# Patient Record
Sex: Female | Born: 1965 | Race: White | Hispanic: No | Marital: Single | Smoking: Current every day smoker
Health system: Southern US, Community
[De-identification: ages and names within clinical notes are randomized; demographics above are authoritative.]

## PROBLEM LIST (undated history)

## (undated) HISTORY — PX: CHOLECYSTECTOMY: SHX55

---

## 2009-11-10 ENCOUNTER — Emergency Department: Payer: Self-pay | Admitting: Emergency Medicine

## 2009-11-17 ENCOUNTER — Emergency Department: Payer: Self-pay | Admitting: Emergency Medicine

## 2010-08-20 ENCOUNTER — Emergency Department: Payer: Self-pay | Admitting: Emergency Medicine

## 2011-03-12 ENCOUNTER — Emergency Department: Payer: Self-pay | Admitting: Emergency Medicine

## 2011-03-14 ENCOUNTER — Emergency Department: Payer: Self-pay | Admitting: *Deleted

## 2011-05-12 ENCOUNTER — Ambulatory Visit: Payer: Self-pay | Admitting: General Practice

## 2011-07-27 ENCOUNTER — Emergency Department: Payer: Self-pay | Admitting: Emergency Medicine

## 2011-10-24 ENCOUNTER — Emergency Department: Payer: Self-pay | Admitting: *Deleted

## 2012-03-10 ENCOUNTER — Emergency Department: Payer: Self-pay | Admitting: Emergency Medicine

## 2017-01-03 ENCOUNTER — Encounter: Payer: Self-pay | Admitting: Emergency Medicine

## 2017-01-03 ENCOUNTER — Emergency Department
Admission: EM | Admit: 2017-01-03 | Discharge: 2017-01-03 | Disposition: A | Discharge status: Home / Self Care | Payer: Self-pay | Attending: Emergency Medicine | Admitting: Emergency Medicine

## 2017-01-03 DIAGNOSIS — F17210 Nicotine dependence, cigarettes, uncomplicated: Secondary | ICD-10-CM | POA: Insufficient documentation

## 2017-01-03 DIAGNOSIS — S61215A Laceration without foreign body of left ring finger without damage to nail, initial encounter: Secondary | ICD-10-CM | POA: Insufficient documentation

## 2017-01-03 DIAGNOSIS — Y990 Civilian activity done for income or pay: Secondary | ICD-10-CM | POA: Insufficient documentation

## 2017-01-03 DIAGNOSIS — W312XXA Contact with powered woodworking and forming machines, initial encounter: Secondary | ICD-10-CM | POA: Insufficient documentation

## 2017-01-03 DIAGNOSIS — Y929 Unspecified place or not applicable: Secondary | ICD-10-CM | POA: Insufficient documentation

## 2017-01-03 DIAGNOSIS — S61217A Laceration without foreign body of left little finger without damage to nail, initial encounter: Secondary | ICD-10-CM | POA: Insufficient documentation

## 2017-01-03 DIAGNOSIS — Y9389 Activity, other specified: Secondary | ICD-10-CM | POA: Insufficient documentation

## 2017-01-03 DIAGNOSIS — S61213A Laceration without foreign body of left middle finger without damage to nail, initial encounter: Secondary | ICD-10-CM | POA: Insufficient documentation

## 2017-01-03 MED ORDER — TETANUS-DIPHTH-ACELL PERTUSSIS 5-2.5-18.5 LF-MCG/0.5 IM SUSP: 0.5000 mL | Freq: Once | INTRAMUSCULAR | Status: DC

## 2017-01-03 MED ORDER — LIDOCAINE HCL (PF) 1 % IJ SOLN
INTRAMUSCULAR | Status: AC
  Administered 2017-01-03: 10 mL
  Filled 2017-01-03: qty 10

## 2017-01-03 MED ORDER — LIDOCAINE HCL (PF) 1 % IJ SOLN
10.0000 mL | Freq: Once | INTRAMUSCULAR | Status: AC
  Administered 2017-01-03: 10 mL

## 2017-01-03 MED ORDER — TRAMADOL HCL 50 MG PO TABS
50.0000 mg | ORAL_TABLET | Freq: Once | ORAL | Status: AC
  Administered 2017-01-03: 50 mg via ORAL
  Filled 2017-01-03: qty 1

## 2017-01-03 NOTE — Discharge Instructions (Signed)
You have have your finger lacerations sutured and dressed. Keep the wound clean, dry, and covered. Wear the gloves while working. [facility] or this ED in 7-10 days for suture removal. Use the supplied dressings to keep the wounds covered. Wash only with soap & water.

## 2017-01-03 NOTE — ED Triage Notes (Signed)
Pt was using skill saw and cut 3rd, 4th, and 5th digit left hand.  Able to move all fingers, pt in pain but does not appear broken at this time. Laceration noted to all 3 fingers.  Pt does not feel like it is broken.

## 2017-01-03 NOTE — ED Notes (Signed)
See triage note  States she was using a skill saw and slipped  Lacerations noted to left 3rd,4th and 5 th fingers  Bleeding controlled

## 2017-01-03 NOTE — ED Triage Notes (Signed)
Pt does not want xray

## 2017-01-03 NOTE — ED Provider Notes (Signed)
[facility] Emergency Department Provider Note ____________________________________________  Time seen: 1700  I have reviewed the triage vital signs and the nursing notes.  HISTORY  Chief Complaint  Hand Injury  HPI Carrie Bradford is a 51 y.o. female presents to the ED for evalluation of injury sustained while using a Skil saw worksite today. Patient describes using a Skil saw to reports, when her and slipped. She sustained lacerations to the distal palmar aspects of her middle, ring, and pinky fingers. She reports a current tetanus status at this time.  History reviewed. No pertinent past medical history.  There are no active problems to display for this patient.   Past Surgical History:  Procedure Laterality Date  . CHOLECYSTECTOMY      Prior to Admission medications   Not on File    Allergies Other  History reviewed. No pertinent family history.  Social History Social History  Substance Use Topics  . Smoking status: Current Every Day Smoker  . Smokeless tobacco: Never Used  . Alcohol use No    Review of Systems  Constitutional: Negative for fever. Cardiovascular: Negative for chest pain. Respiratory: Negative for shortness of breath. Musculoskeletal: Negative for back pain. Skin: Negative for rash. Finger lacerations as above. Neurological: Negative for headaches, focal weakness or numbness. ____________________________________________  PHYSICAL EXAM:  VITAL SIGNS: ED Triage Vitals [01/03/17 1807]  Enc Vitals Group     BP      Pulse      Resp      Temp      Temp src      SpO2      Weight      Height      Head Circumference      Peak Flow      Pain Score 9     Pain Loc      Pain Edu?      Excl. in GC?    Constitutional: Alert and oriented. Well appearing and in no distress. Head: Normocephalic and atraumatic. Cardiovascular: Normal rate, regular rhythm. Normal distal pulses. Respiratory: Normal respiratory effort. No  wheezes/rales/rhonchi. Gastrointestinal: Soft and nontender. No distention. Musculoskeletal: multiple superficial lacerations are noted to the dorsal-ulnar aspect of the left middle finger, left ring finger, and the left pinky. Pinky is the most disruptive with a superficial flap noted. Patient with a legitimate and normal composite fist to the left hand.Nontender with normal range of motion in all extremities.  Neurologic:  Normal gross sensation. Normal intrinsic and opposition testing. No gross focal neurologic deficits are appreciated. Skin:  Skin is warm, dry and intact. No rash noted. ____________________________________________  PROCEDURES  Ultram 50 mg PO  LACERATION REPAIR Performed by: Lissa Hoard Authorized by: Lissa Hoard Consent: Verbal consent obtained. Risks and benefits: risks, benefits and alternatives were discussed Consent given by: patient Patient identity confirmed: provided demographic data Prepped and Draped in normal sterile fashion Wound explored  Laceration Location: left pinky finger.  Laceration Length: 1 cm  No Foreign Bodies seen or palpated  Anesthesia: transthecal local infiltration  Local anesthetic: lidocaine 1% w/o epinephrine  Anesthetic total: 3 ml  Irrigation method: syringe Amount of cleaning: standard  Skin closure: 4-0 nylon  Number of sutures: 2   Technique: interrupted  Patient tolerance: Patient tolerated the procedure well with no immediate complications.  The other superficial wounds are dressed with a Vaseline-soaked gauze, nonstick dressing, and tube gauze. ____________________________________________  INITIAL IMPRESSION / ASSESSMENT AND PLAN / ED COURSE  Patient with ED evaluation of multiple lacerations to the last 3 digits of the left hand. Patient with primarily superficial wounds with irregular jagged edges. Single flap to the pinky is secured using 2 interrupted sutures discharged  instructions as well as supplies. She is released to return to work with suction to keep the wound clean, and dry. ____________________________________________  FINAL CLINICAL IMPRESSION(S) / ED DIAGNOSES  Final diagnoses:  Laceration of left little finger without foreign body without damage to nail, initial encounter  Laceration of left ring finger without foreign body without damage to nail, initial encounter  Laceration of left middle finger without foreign body without damage to nail, initial encounter      Lissa HoardMenshew, Gavin Faivre V Bacon, PA-C 01/03/17 1833    Nita SickleVeronese, Foster Brook, MD 01/03/17 2023

## 2018-05-01 ENCOUNTER — Emergency Department: Payer: Self-pay

## 2018-05-01 ENCOUNTER — Other Ambulatory Visit: Payer: Self-pay

## 2018-05-01 ENCOUNTER — Encounter: Payer: Self-pay | Admitting: Emergency Medicine

## 2018-05-01 ENCOUNTER — Emergency Department
Admission: EM | Admit: 2018-05-01 | Discharge: 2018-05-01 | Disposition: A | Discharge status: Home / Self Care | Payer: Self-pay | Attending: Emergency Medicine | Admitting: Emergency Medicine

## 2018-05-01 DIAGNOSIS — F14921 Cocaine use, unspecified with intoxication delirium: Secondary | ICD-10-CM | POA: Insufficient documentation

## 2018-05-01 DIAGNOSIS — Y929 Unspecified place or not applicable: Secondary | ICD-10-CM | POA: Insufficient documentation

## 2018-05-01 DIAGNOSIS — R0602 Shortness of breath: Secondary | ICD-10-CM

## 2018-05-01 DIAGNOSIS — Y902 Blood alcohol level of 40-59 mg/100 ml: Secondary | ICD-10-CM | POA: Insufficient documentation

## 2018-05-01 DIAGNOSIS — F17210 Nicotine dependence, cigarettes, uncomplicated: Secondary | ICD-10-CM | POA: Insufficient documentation

## 2018-05-01 DIAGNOSIS — S1090XA Unspecified superficial injury of unspecified part of neck, initial encounter: Secondary | ICD-10-CM | POA: Insufficient documentation

## 2018-05-01 DIAGNOSIS — F10929 Alcohol use, unspecified with intoxication, unspecified: Secondary | ICD-10-CM | POA: Insufficient documentation

## 2018-05-01 DIAGNOSIS — S40021A Contusion of right upper arm, initial encounter: Secondary | ICD-10-CM | POA: Insufficient documentation

## 2018-05-01 DIAGNOSIS — J984 Other disorders of lung: Secondary | ICD-10-CM | POA: Insufficient documentation

## 2018-05-01 DIAGNOSIS — Y999 Unspecified external cause status: Secondary | ICD-10-CM | POA: Insufficient documentation

## 2018-05-01 DIAGNOSIS — Y939 Activity, unspecified: Secondary | ICD-10-CM | POA: Insufficient documentation

## 2018-05-01 LAB — RAPID HIV SCREEN (HIV 1/2 AB+AG)
HIV 1/2 Antibodies: NONREACTIVE
HIV-1 P24 Antigen - HIV24: NONREACTIVE

## 2018-05-01 LAB — COMPREHENSIVE METABOLIC PANEL
ALK PHOS: 48 U/L (ref 38–126)
ALT: 13 U/L (ref 0–44)
AST: 36 U/L (ref 15–41)
Albumin: 3.4 g/dL — ABNORMAL LOW (ref 3.5–5.0)
Anion gap: 5 (ref 5–15)
BUN: 9 mg/dL (ref 6–20)
CALCIUM: 8.4 mg/dL — AB (ref 8.9–10.3)
CO2: 24 mmol/L (ref 22–32)
Chloride: 113 mmol/L — ABNORMAL HIGH (ref 98–111)
Creatinine, Ser: 0.71 mg/dL (ref 0.44–1.00)
GFR calc Af Amer: >60 mL/min (ref >60)
GFR calc non Af Amer: >60 mL/min (ref >60)
Glucose, Bld: 88 mg/dL (ref 70–99)
Potassium: 4 mmol/L (ref 3.5–5.1)
SODIUM: 142 mmol/L (ref 135–145)
Total Bilirubin: 0.3 mg/dL (ref 0.3–1.2)
Total Protein: 5.8 g/dL — ABNORMAL LOW (ref 6.5–8.1)

## 2018-05-01 LAB — CBC WITH DIFFERENTIAL/PLATELET
ABS IMMATURE GRANULOCYTES: 0.06 10*3/uL (ref 0.00–0.07)
BASOS ABS: 0.1 10*3/uL (ref 0.0–0.1)
Basophils Relative: 1 %
EOS PCT: 8 %
Eosinophils Absolute: 0.8 10*3/uL — ABNORMAL HIGH (ref 0.0–0.5)
HCT: 35 % — ABNORMAL LOW (ref 36.0–46.0)
HEMOGLOBIN: 11.2 g/dL — AB (ref 12.0–15.0)
Immature Granulocytes: 1 %
LYMPHS PCT: 19 %
Lymphs Abs: 1.9 10*3/uL (ref 0.7–4.0)
MCH: 30.3 pg (ref 26.0–34.0)
MCHC: 32 g/dL (ref 30.0–36.0)
MCV: 94.6 fL (ref 80.0–100.0)
MONO ABS: 1.1 10*3/uL — AB (ref 0.1–1.0)
Monocytes Relative: 10 %
NEUTROS ABS: 6.5 10*3/uL (ref 1.7–7.7)
NRBC: 0 % (ref 0.0–0.2)
Neutrophils Relative %: 61 %
Platelets: 277 10*3/uL (ref 150–400)
RBC: 3.7 MIL/uL — ABNORMAL LOW (ref 3.87–5.11)
RDW: 13.4 % (ref 11.5–15.5)
WBC: 10.5 10*3/uL (ref 4.0–10.5)

## 2018-05-01 LAB — URINE DRUG SCREEN, QUALITATIVE (ARMC ONLY)
AMPHETAMINES, UR SCREEN: NOT DETECTED
Barbiturates, Ur Screen: NOT DETECTED
Benzodiazepine, Ur Scrn: NOT DETECTED
COCAINE METABOLITE, UR ~~LOC~~: POSITIVE — AB
Cannabinoid 50 Ng, Ur ~~LOC~~: NOT DETECTED
MDMA (ECSTASY) UR SCREEN: NOT DETECTED
Methadone Scn, Ur: NOT DETECTED
Opiate, Ur Screen: NOT DETECTED
PHENCYCLIDINE (PCP) UR S: NOT DETECTED
TRICYCLIC, UR SCREEN: NOT DETECTED

## 2018-05-01 LAB — CHLAMYDIA/NGC RT PCR (ARMC ONLY)
CHLAMYDIA TR: NOT DETECTED
N gonorrhoeae: NOT DETECTED

## 2018-05-01 LAB — TROPONIN I: Troponin I: <0.03 ng/mL (ref <0.03)

## 2018-05-01 LAB — ETHANOL: ALCOHOL ETHYL (B): 50 mg/dL — AB (ref <10)

## 2018-05-01 MED ORDER — POVIDONE-IODINE 0.3 % VA SOLN: 1.0000 | Freq: Once | VAGINAL | Status: DC

## 2018-05-01 MED ORDER — OXYMETAZOLINE HCL 0.05 % NA SOLN
1.0000 | Freq: Once | NASAL | Status: DC
  Filled 2018-05-01: qty 15

## 2018-05-01 MED ORDER — IOPAMIDOL (ISOVUE-370) INJECTION 76%
75.0000 mL | Freq: Once | INTRAVENOUS | Status: AC | PRN
  Administered 2018-05-01: 100 mL via INTRAVENOUS

## 2018-05-01 MED ORDER — LORAZEPAM 2 MG/ML IJ SOLN
INTRAMUSCULAR | Status: AC
  Filled 2018-05-01: qty 1

## 2018-05-01 MED ORDER — PSEUDOEPHEDRINE HCL 30 MG PO TABS
60.0000 mg | ORAL_TABLET | Freq: Once | ORAL | Status: DC
  Filled 2018-05-01: qty 2

## 2018-05-01 MED ORDER — HALOPERIDOL LACTATE 5 MG/ML IJ SOLN: 5.0000 mg | Freq: Once | INTRAMUSCULAR | Status: DC

## 2018-05-01 MED ORDER — LORAZEPAM 2 MG/ML IJ SOLN
2.0000 mg | Freq: Once | INTRAMUSCULAR | Status: AC
  Administered 2018-05-01: 2 mg via INTRAVENOUS
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
## 2018-05-01 NOTE — ED Notes (Signed)
Patient to waiting room via wheelchair by EMS.  EMS reports patient complaining of short of breath room air pulse oxi 97%.  Patient has ETOH on board and is able to speak in complete sentences.
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
## 2018-05-01 NOTE — ED Notes (Signed)
Awaiting TB gold test kit from lab.
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
## 2018-05-01 NOTE — ED Notes (Signed)
Report to Alicia, RN.
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
## 2018-05-01 NOTE — ED Notes (Signed)
Pt calm and sleepy att, difficult to arouse
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
## 2018-05-01 NOTE — ED Notes (Signed)
TB Gold kit drawn and sent to lab.
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
## 2018-05-01 NOTE — ED Notes (Signed)
Pt awakens to verbal stimuli. When asked orientation questions, she mumbles. Pt alert but does not form appropriate sentences at this time. Pt drowsy.

## 2018-05-01 NOTE — ED Notes (Signed)
Pt taken to lobby. Pt top call for ride.
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
## 2018-05-02 LAB — RPR: RPR Ser Ql: NONREACTIVE

## 2018-05-04 LAB — QUANTIFERON-TB GOLD PLUS (RQFGPL)
QUANTIFERON NIL VALUE: 0.03 [IU]/mL
QUANTIFERON TB1 AG VALUE: 0.06 [IU]/mL
QuantiFERON TB2 Ag Value: 0.04 IU/mL

## 2018-05-04 LAB — QUANTIFERON-TB GOLD PLUS: QUANTIFERON-TB GOLD PLUS: NEGATIVE

## 2019-05-07 IMAGING — CR DG CHEST 2V
2 series · 2 of 2 positions shown · non-contrast
Comparison: 08/20/2010

CLINICAL DATA: Shortness of breath. Difficulty breathing. Sinus
congestion.

EXAM:
CHEST - 2 VIEW

[chest pa]
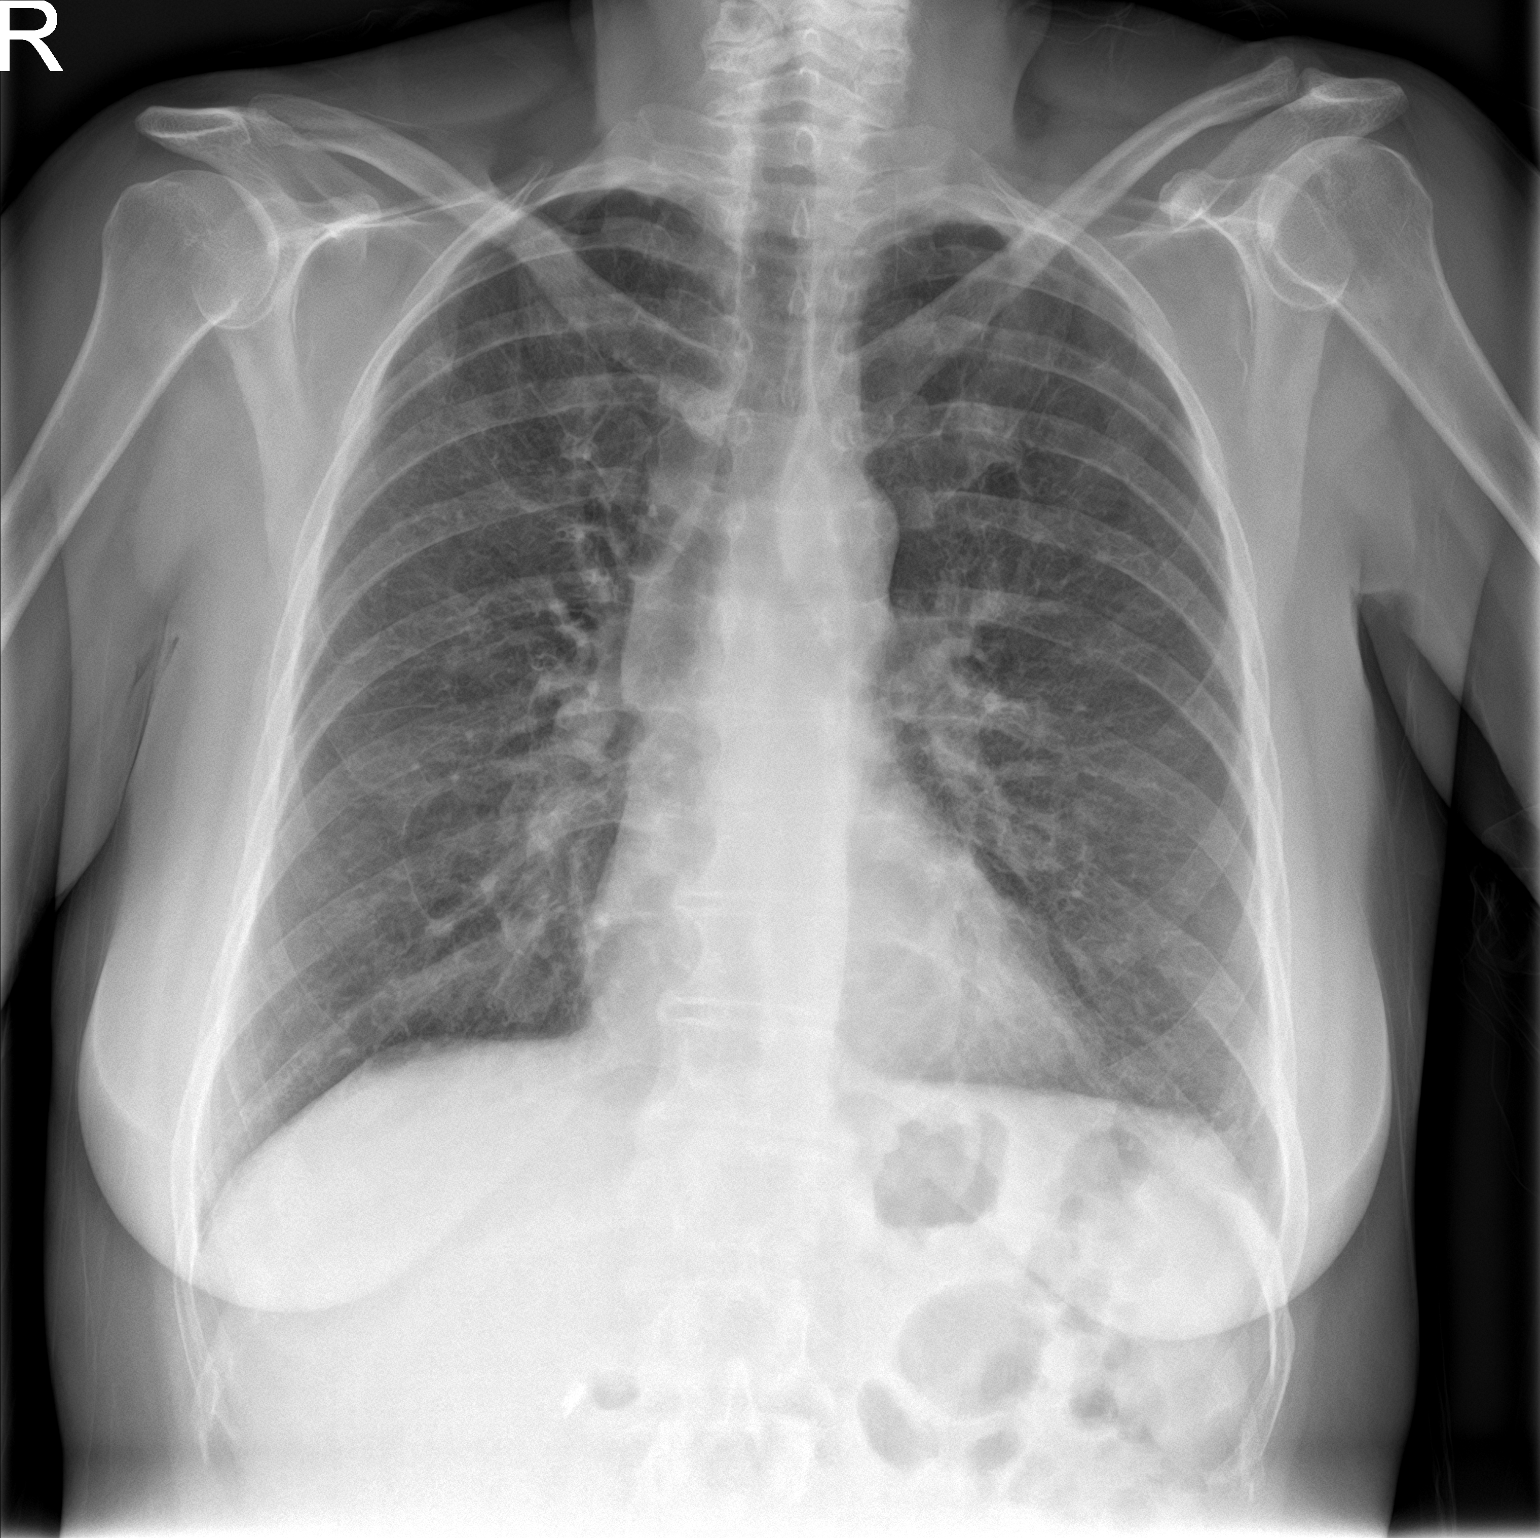

[chest lat]
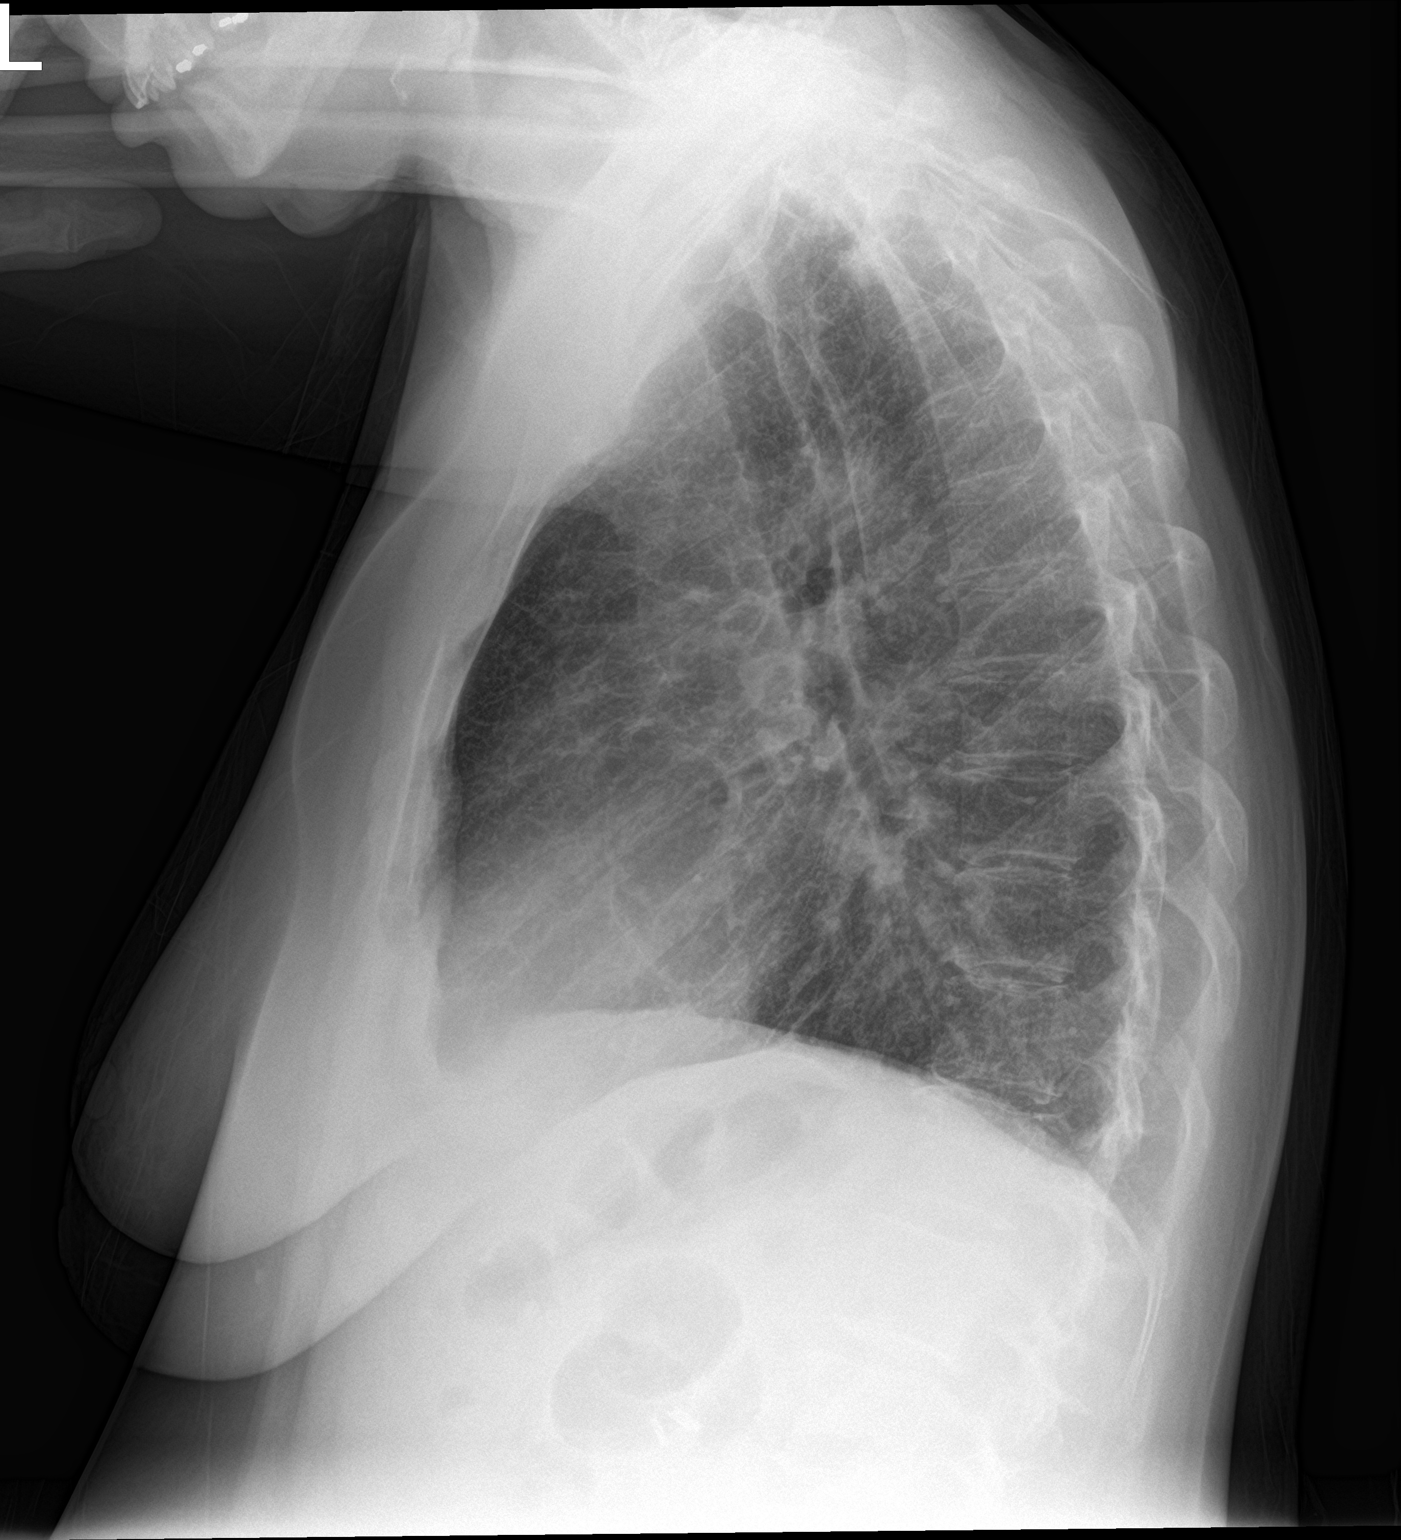

[2 of 2 positions shown; findings below may reference images not displayed]

FINDINGS: Developing right upper lung nodules with possible cavitation
measuring up to about 8 mm CT chest recommended. Peribronchial
thickening. No focal consolidation or airspace disease. No blunting
of costophrenic angles. No pneumothorax. Mediastinal contours appear
intact. Heart size and pulmonary vascularity are normal..
IMPRESSION: Developing right upper lung nodules with possible cavitation. CT
recommended for further evaluation.
